# Patient Record
Sex: Female | Born: 1995 | Race: Black or African American | Hispanic: No | Marital: Single
Health system: Southern US, Community
[De-identification: ages and names within clinical notes are randomized; demographics above are authoritative.]

---

## 2020-10-08 ENCOUNTER — Emergency Department (HOSPITAL_COMMUNITY)
Admission: EM | Admit: 2020-10-08 | Discharge: 2020-10-08 | Disposition: A | Discharge status: Home / Self Care | Payer: BLUE CROSS/BLUE SHIELD | Attending: Emergency Medicine | Admitting: Emergency Medicine

## 2020-10-08 ENCOUNTER — Emergency Department (HOSPITAL_COMMUNITY): Payer: BLUE CROSS/BLUE SHIELD

## 2020-10-08 DIAGNOSIS — S8012XA Contusion of left lower leg, initial encounter: Secondary | ICD-10-CM | POA: Diagnosis not present

## 2020-10-08 DIAGNOSIS — Y999 Unspecified external cause status: Secondary | ICD-10-CM | POA: Insufficient documentation

## 2020-10-08 DIAGNOSIS — Y9389 Activity, other specified: Secondary | ICD-10-CM | POA: Insufficient documentation

## 2020-10-08 DIAGNOSIS — Y92410 Unspecified street and highway as the place of occurrence of the external cause: Secondary | ICD-10-CM | POA: Diagnosis not present

## 2020-10-08 DIAGNOSIS — S8992XA Unspecified injury of left lower leg, initial encounter: Secondary | ICD-10-CM | POA: Diagnosis present
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
